# Patient Record
Sex: Female | Born: 2000 | Race: White | Hispanic: No | Marital: Single | Smoking: Never smoker
Health system: Southern US, Community
[De-identification: ages and names within clinical notes are randomized; demographics above are authoritative.]

## PROBLEM LIST (undated history)

## (undated) DIAGNOSIS — J45909 Unspecified asthma, uncomplicated: Secondary | ICD-10-CM

## (undated) DIAGNOSIS — M419 Scoliosis, unspecified: Secondary | ICD-10-CM

## (undated) HISTORY — DX: Scoliosis, unspecified: M41.9

## (undated) HISTORY — DX: Unspecified asthma, uncomplicated: J45.909

## (undated) HISTORY — PX: NO PAST SURGERIES: SHX2092

---
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
## 2021-10-25 ENCOUNTER — Other Ambulatory Visit: Payer: Self-pay

## 2021-10-25 ENCOUNTER — Encounter: Payer: Self-pay | Admitting: Medical-Surgical

## 2021-10-25 ENCOUNTER — Ambulatory Visit (INDEPENDENT_AMBULATORY_CARE_PROVIDER_SITE_OTHER): Payer: Medicaid Other | Admitting: Medical-Surgical

## 2021-10-25 VITALS — BP 104/71 | HR 98 | Resp 20 | Ht 65.0 in | Wt 125.3 lb

## 2021-10-25 DIAGNOSIS — E611 Iron deficiency: Secondary | ICD-10-CM

## 2021-10-25 DIAGNOSIS — Z7689 Persons encountering health services in other specified circumstances: Secondary | ICD-10-CM | POA: Diagnosis not present

## 2021-10-25 NOTE — Progress Notes (Signed)
New Patient Office Visit  Subjective:  Patient ID: Abigail Davis, female    DOB: December 05, 2000  Age: 20 y.o. MRN: [id number]  CC:  Chief Complaint  Patient presents with   Establish Care     HPI Abigail Davis presents to establish care.  She is a pleasant 20 year old female accompanied by her mother presenting today with reports of a history of having low iron.  She donated blood recently and was told that she had low iron.  She did start taking a multivitamin as well as an iron supplement on a daily basis.  She tolerates this well without side effects.  Has not had any lab work checked since then to evaluate her status.  She is currently attending [employer], living on campus.  She is taking the child life program and would like to be a child life specialist after graduation.  Notes that she does have very strange reactions to some medications.  Allegra, Benadryl, and Junel all caused psychosis with depression and severe anxiety.  She is very hesitant to take medications due to this reaction.  Past Medical History:  Diagnosis Date   Asthma    Scoliosis     History reviewed. No pertinent surgical history.  History reviewed. No pertinent family history.  Social History   Socioeconomic History   Marital status: Single    Spouse name: Not on file   Number of children: Not on file   Years of education: Not on file   Highest education level: Not on file  Occupational History   Not on file  Tobacco Use   Smoking status: Never   Smokeless tobacco: Never  Vaping Use   Vaping Use: Never used  Substance and Sexual Activity   Alcohol use: Never   Drug use: Never   Sexual activity: Never  Other Topics Concern   Not on file  Social History Narrative   Not on file   Social Determinants of Health   Financial Resource Strain: Not on file  Food Insecurity: Not on file  Transportation Needs: Not on file  Physical Activity: Not on file  Stress: Not on file  Social Connections:  Not on file  Intimate Partner Violence: Not on file    ROS Review of Systems  Constitutional:  Negative for chills, fatigue, fever and unexpected weight change.  HENT:  Negative for congestion, rhinorrhea, sinus pressure and sore throat.   Respiratory:  Negative for cough, chest tightness and shortness of breath.   Cardiovascular:  Negative for chest pain, palpitations and leg swelling.  Gastrointestinal:  Negative for abdominal pain, constipation, diarrhea, nausea and vomiting.  Endocrine: Negative for cold intolerance and heat intolerance.  Genitourinary:  Negative for dysuria, frequency and urgency.  Skin:  Negative for rash and wound.  Neurological:  Negative for dizziness, light-headedness and headaches.  Hematological:  Does not bruise/bleed easily.  Psychiatric/Behavioral:  Negative for dysphoric mood, self-injury, sleep disturbance and suicidal ideas. The patient is not nervous/anxious.    Objective:   Today's Vitals: BP 104/71 (BP Location: Left Arm, Patient Position: Sitting, Cuff Size: Normal)    Pulse 98    Resp 20    Ht 5\' 5"  (1.651 m)    Wt 125 lb 4.8 oz (56.8 kg)    LMP 09/30/2021 (Exact Date)    SpO2 97%    BMI 20.85 kg/m   Physical Exam Vitals reviewed.  Constitutional:      General: She is not in acute distress.    Appearance: Normal  appearance. She is normal weight. She is not ill-appearing.  HENT:     Head: Normocephalic and atraumatic.  Cardiovascular:     Rate and Rhythm: Normal rate and regular rhythm.     Pulses: Normal pulses.     Heart sounds: Normal heart sounds. No murmur heard.   No friction rub. No gallop.  Pulmonary:     Effort: Pulmonary effort is normal. No respiratory distress.     Breath sounds: Normal breath sounds. No wheezing.  Skin:    General: Skin is warm and dry.  Neurological:     Mental Status: She is alert and oriented to person, place, and time.  Psychiatric:        Mood and Affect: Mood normal.        Behavior: Behavior  normal.        Thought Content: Thought content normal.        Judgment: Judgment normal.    Assessment & Plan:   1. Encounter to establish care Reviewed available information and discussed care concerns with patient.   2. Iron deficiency Checking CBC with differential and iron panel today.  Continue iron supplementation. - CBC w/Diff/Platelet - Fe+TIBC+Fer  Outpatient Encounter Medications as of 10/25/2021  Medication Sig   [DISCONTINUED] albuterol (PROVENTIL HFA) 108 (90 Base) MCG/ACT inhaler Inhale 2 puffs into the lungs as needed for wheezing or shortness of breath.   No facility-administered encounter medications on file as of 10/25/2021.    Follow-up: Return if symptoms worsen or fail to improve.   Thayer Ohm, DNP, APRN, FNP-BC [facility] and Sports Medicine

## 2021-10-26 LAB — CBC WITH DIFFERENTIAL/PLATELET
Absolute Monocytes: 672 cells/uL (ref 200–950)
Basophils Absolute: 58 cells/uL (ref 0–200)
Basophils Relative: 0.7 %
Eosinophils Absolute: 755 cells/uL — ABNORMAL HIGH (ref 15–500)
Eosinophils Relative: 9.1 %
HCT: 38.7 % (ref 35.0–45.0)
Hemoglobin: 13 g/dL (ref 11.7–15.5)
Lymphs Abs: 3237 cells/uL (ref 850–3900)
MCH: 27.8 pg (ref 27.0–33.0)
MCHC: 33.6 g/dL (ref 32.0–36.0)
MCV: 82.7 fL (ref 80.0–100.0)
MPV: 11.3 fL (ref 7.5–12.5)
Monocytes Relative: 8.1 %
Neutro Abs: 3577 cells/uL (ref 1500–7800)
Neutrophils Relative %: 43.1 %
Platelets: 328 10*3/uL (ref 140–400)
RBC: 4.68 10*6/uL (ref 3.80–5.10)
RDW: 13.8 % (ref 11.0–15.0)
Total Lymphocyte: 39 %
WBC: 8.3 10*3/uL (ref 3.8–10.8)

## 2021-10-26 LAB — IRON,TIBC AND FERRITIN PANEL
%SAT: 40 % (calc) (ref 15–45)
Ferritin: 9 ng/mL — ABNORMAL LOW (ref 16–154)
Iron: 149 ug/dL (ref 27–164)
TIBC: 369 mcg/dL (calc) (ref 271–448)

## 2022-04-05 ENCOUNTER — Other Ambulatory Visit: Payer: Self-pay

## 2022-04-05 MED ORDER — ALBUTEROL SULFATE HFA 108 (90 BASE) MCG/ACT IN AERS: 2.0000 | INHALATION_SPRAY | RESPIRATORY_TRACT | 2 refills | Status: DC | PRN

## 2022-04-05 NOTE — Progress Notes (Signed)
LVM advising pt of RX.  Tiajuana Amass, CMA

## 2022-04-05 NOTE — Progress Notes (Signed)
Patient called wanting to know if she can get a new Rx for her Albuterol. She started having flare ups last night and needs this refilled.  This is not in her current medication list.

## 2022-04-05 NOTE — Progress Notes (Signed)
Albuterol sent to the pharmacy on file.  ___________________________________________ Thayer Ohm, DNP, APRN, FNP-BC Primary Care and Sports Medicine [facility] [place]

## 2022-04-10 ENCOUNTER — Telehealth: Payer: Self-pay

## 2022-04-10 MED ORDER — ALBUTEROL SULFATE HFA 108 (90 BASE) MCG/ACT IN AERS: 2.0000 | INHALATION_SPRAY | Freq: Four times a day (QID) | RESPIRATORY_TRACT | 2 refills | Status: AC | PRN

## 2022-04-10 NOTE — Telephone Encounter (Signed)
Order clarified and resent

## 2022-04-10 NOTE — Telephone Encounter (Signed)
Patient called to let us know that the pharmacy needed more information for the Albuterol.  I contacted the pharmacy and they need the frequency in order to fill the prescription.

## 2022-10-20 ENCOUNTER — Encounter: Payer: Self-pay | Admitting: Medical-Surgical

## 2022-10-20 ENCOUNTER — Ambulatory Visit (INDEPENDENT_AMBULATORY_CARE_PROVIDER_SITE_OTHER): Payer: Medicaid Other | Admitting: Medical-Surgical

## 2022-10-20 VITALS — BP 124/70 | HR 113 | Resp 20 | Ht 65.0 in | Wt 120.1 lb

## 2022-10-20 DIAGNOSIS — E611 Iron deficiency: Secondary | ICD-10-CM | POA: Diagnosis not present

## 2022-10-20 DIAGNOSIS — Z8269 Family history of other diseases of the musculoskeletal system and connective tissue: Secondary | ICD-10-CM

## 2022-10-20 DIAGNOSIS — G8929 Other chronic pain: Secondary | ICD-10-CM

## 2022-10-20 DIAGNOSIS — R198 Other specified symptoms and signs involving the digestive system and abdomen: Secondary | ICD-10-CM | POA: Diagnosis not present

## 2022-10-20 DIAGNOSIS — M545 Low back pain, unspecified: Secondary | ICD-10-CM

## 2022-10-20 NOTE — Progress Notes (Signed)
Established Patient Office Visit  Subjective   Patient ID: Abigail Davis, female   DOB: [birth year] Age: 21 y.o. MRN: [id number]   Chief Complaint  Patient presents with   Labs Only    Gene testing for HLAB27   HPI Pleasant 21 year old female presenting today for the following:  Iron deficiency: Currently taking iron Gummies and a women's multivitamin once daily.  Previous history of iron deficiency and would like to have this rechecked today.  Denies unusual fatigue, dizziness, or syncope.  Does have considerable concerns about genetic testing.  Notes that her sister was recently found to be positive for the HLA-B27 gene and is now being seen by rheumatology.  Since this diagnosis of her sister, she has started thinking about her own health related issues.  She used to be a cross-country runner and notes that her left knee developed a plica.  It was painful for a while but she no longer runs and this pain has resolved.  She does have scoliosis and had associated back pain several years ago but this also has gotten better over the last few years.  Notes that she has a very sensitive stomach and often has diarrhea.  Has done some research and found that all of these issues can be connected with rheumatoid arthritis or ankylosing spondylosis.  Is requesting additional testing to see if she is also positive for the gene or has any indicators of these conditions.  Aware that insurance may not cover it but they will be willing to pay out-of-pocket for this.   Objective:    Vitals:   10/20/22 0823  BP: 124/70  Pulse: (!) 113  Resp: 20  Height: _0  (1.651 m)  Weight: 120 lb 1.6 oz (54.5 kg)  SpO2: 97%  BMI (Calculated): 19.99    Physical Exam Vitals and nursing note reviewed.  Constitutional:      General: She is not in acute distress.    Appearance: Normal appearance. She is normal weight. She is not ill-appearing.  HENT:     Head: Normocephalic and atraumatic.  Cardiovascular:      Rate and Rhythm: Normal rate and regular rhythm.     Pulses: Normal pulses.     Heart sounds: Normal heart sounds.  Pulmonary:     Effort: Pulmonary effort is normal. No respiratory distress.     Breath sounds: Normal breath sounds. No wheezing, rhonchi or rales.  Skin:    General: Skin is warm and dry.  Neurological:     Mental Status: She is alert and oriented to person, place, and time.  Psychiatric:        Mood and Affect: Mood normal.        Behavior: Behavior normal.        Thought Content: Thought content normal.        Judgment: Judgment normal.   No results found for this or any previous visit (from the past 24 hour(s)).     The ASCVD Risk score (Arnett DK, et al., 2019) failed to calculate for the following reasons:   The 2019 ASCVD risk score is only valid for ages 35 to 58   Assessment & Plan:   1. Iron deficiency Checking CBC and iron panel today. - Fe+TIBC+Fer - CBC  2. Family history of ankylosing spondylitis 3. Chronic low back pain without sciatica, unspecified back pain laterality Checking labs as below. - ANA,IFA RA Diag Pnl w/rflx Tit/Patn - Sed Rate (ESR) - C-reactive protein - Cyclic citrul  peptide antibody, IgG - HLA-B27 antigen  4. GI symptoms Suspect this may be related to IBS or even anxiety.  Currently managed with lifestyle and dietary modifications.  No indication for medication today but we will do the investigation as above.  Return in about 1 year (around 10/21/2023) for annual physical exam.  ___________________________________________ Clearnce Sorrel, DNP, APRN, FNP-BC Primary Care and [place]

## 2022-10-23 LAB — ANTI-NUCLEAR AB-TITER (ANA TITER)
ANA TITER: 1:80 {titer} — ABNORMAL HIGH
ANA Titer 1: 1:80 {titer} — ABNORMAL HIGH

## 2022-10-23 LAB — IRON,TIBC AND FERRITIN PANEL
%SAT: 28 % (calc) (ref 16–45)
Ferritin: 17 ng/mL (ref 16–154)
Iron: 96 ug/dL (ref 40–190)
TIBC: 345 mcg/dL (calc) (ref 250–450)

## 2022-10-23 LAB — CBC
HCT: 41.1 % (ref 35.0–45.0)
Hemoglobin: 14 g/dL (ref 11.7–15.5)
MCH: 28.8 pg (ref 27.0–33.0)
MCHC: 34.1 g/dL (ref 32.0–36.0)
MCV: 84.6 fL (ref 80.0–100.0)
MPV: 10.9 fL (ref 7.5–12.5)
Platelets: 336 10*3/uL (ref 140–400)
RBC: 4.86 10*6/uL (ref 3.80–5.10)
RDW: 12 % (ref 11.0–15.0)
WBC: 7.3 10*3/uL (ref 3.8–10.8)

## 2022-10-23 LAB — HLA-B27 ANTIGEN: HLA-B27 Antigen: POSITIVE — AB

## 2022-10-23 LAB — ANA,IFA RA DIAG PNL W/RFLX TIT/PATN
Anti Nuclear Antibody (ANA): POSITIVE — AB
Cyclic Citrullin Peptide Ab: <16 UNITS
Rheumatoid fact SerPl-aCnc: <14 IU/mL (ref <14)

## 2022-10-23 LAB — SEDIMENTATION RATE: Sed Rate: 2 mm/h (ref 0–20)

## 2022-10-23 LAB — C-REACTIVE PROTEIN: CRP: <0.2 mg/L (ref <8.0)

## 2023-06-05 ENCOUNTER — Ambulatory Visit (INDEPENDENT_AMBULATORY_CARE_PROVIDER_SITE_OTHER): Payer: Medicaid Other | Admitting: Medical-Surgical

## 2023-06-05 ENCOUNTER — Encounter: Payer: Self-pay | Admitting: Medical-Surgical

## 2023-06-05 VITALS — BP 94/64 | HR 72 | Ht 64.0 in | Wt 122.8 lb

## 2023-06-05 DIAGNOSIS — Z0000 Encounter for general adult medical examination without abnormal findings: Secondary | ICD-10-CM | POA: Diagnosis not present

## 2023-06-05 DIAGNOSIS — Z124 Encounter for screening for malignant neoplasm of cervix: Secondary | ICD-10-CM | POA: Diagnosis not present

## 2023-06-05 NOTE — Progress Notes (Signed)
Complete physical exam  Patient: Abigail Davis   DOB: [birth year]   22 y.o. Female  MRN: [id number]  Subjective:    Chief Complaint  Patient presents with   Annual Exam    Patient  states her sister has Ankylosing spondylitis and requesting to be evaluated for the gene for this .- some lab work specific for this be drawn today ( Lipprotein A and ApolipoproteinB) - also requesting an order for echocardiogram complete without enhancing agent.     Abigail Davis is a 22 y.o. female who presents today for a complete physical exam. She reports consuming a general diet.  Walking a couple times weekly.  She generally feels well. She reports sleeping well. She does not have additional problems to discuss today.    Most recent fall risk assessment:    06/05/2023   10:20 AM  Fall Risk   Falls in the past year? 0  Number falls in past yr: 0  Injury with Fall? 0  Risk for fall due to : No Fall Risks  Follow up Falls evaluation completed     Most recent depression screenings:    06/05/2023   10:21 AM 10/20/2022    8:27 AM  PHQ 2/9 Scores  PHQ - 2 Score 0 0    Vision:Not within last year , Dental: No current dental problems and Receives regular dental care, and STD: The patient denies history of sexually transmitted disease.    Patient Care Team: Christen Butter, NP as PCP - General (Nurse Practitioner)   Outpatient Medications Prior to Visit  Medication Sig   albuterol (PROVENTIL HFA) 108 (90 Base) MCG/ACT inhaler Inhale 2 puffs into the lungs every 6 (six) hours as needed for wheezing or shortness of breath.   No facility-administered medications prior to visit.   Review of Systems  Constitutional:  Negative for chills, fever, malaise/fatigue and weight loss.  HENT:  Negative for congestion, ear pain, hearing loss, sinus pain and sore throat.   Eyes:  Negative for blurred vision, photophobia and pain.  Respiratory:  Negative for cough, shortness of breath and wheezing.    Cardiovascular:  Negative for chest pain, palpitations and leg swelling.  Gastrointestinal:  Negative for abdominal pain, constipation, diarrhea, heartburn, nausea and vomiting.  Genitourinary:  Negative for dysuria, frequency and urgency.  Musculoskeletal:  Negative for falls and neck pain.  Skin:  Negative for itching and rash.  Neurological:  Negative for dizziness, weakness and headaches.  Endo/Heme/Allergies:  Negative for environmental allergies and polydipsia. Does not bruise/bleed easily.  Psychiatric/Behavioral:  Negative for depression, substance abuse and suicidal ideas. The patient is not nervous/anxious and does not have insomnia.      Objective:    BP 94/64   Pulse 72   Ht 5\' 4"  (1.626 m)   Wt 122 lb 12 oz (55.7 kg)   SpO2 99%   BMI 21.07 kg/m    Physical Exam Constitutional:      General: She is not in acute distress.    Appearance: Normal appearance. She is not ill-appearing.  HENT:     Head: Normocephalic and atraumatic.     Right Ear: Tympanic membrane, ear canal and external ear normal. There is no impacted cerumen.     Left Ear: Tympanic membrane, ear canal and external ear normal. There is no impacted cerumen.     Nose: Nose normal. No congestion or rhinorrhea.     Mouth/Throat:     Mouth: Mucous membranes are moist.  Pharynx: No oropharyngeal exudate or posterior oropharyngeal erythema.  Eyes:     General: No scleral icterus.       Right eye: No discharge.        Left eye: No discharge.     Extraocular Movements: Extraocular movements intact.     Conjunctiva/sclera: Conjunctivae normal.     Pupils: Pupils are equal, round, and reactive to light.  Neck:     Thyroid: No thyromegaly.     Vascular: No carotid bruit or JVD.     Trachea: Trachea normal.  Cardiovascular:     Rate and Rhythm: Normal rate and regular rhythm.     Pulses: Normal pulses.     Heart sounds: Normal heart sounds. No murmur heard.    No friction rub. No gallop.  Pulmonary:      Effort: Pulmonary effort is normal. No respiratory distress.     Breath sounds: Normal breath sounds. No wheezing.  Abdominal:     General: Bowel sounds are normal. There is no distension.     Palpations: Abdomen is soft.     Tenderness: There is no abdominal tenderness. There is no guarding.  Musculoskeletal:        General: Normal range of motion.     Cervical back: Normal range of motion and neck supple.     Thoracic back: Scoliosis present.  Lymphadenopathy:     Cervical: No cervical adenopathy.  Skin:    General: Skin is warm and dry.  Neurological:     Mental Status: She is alert and oriented to person, place, and time.     Cranial Nerves: No cranial nerve deficit.  Psychiatric:        Mood and Affect: Mood normal.        Behavior: Behavior normal.        Thought Content: Thought content normal.        Judgment: Judgment normal.      No results found for any visits on 06/05/23.     Assessment & Plan:    Routine Health Maintenance and Physical Exam  Immunization History  Administered Date(s) Administered   DTaP 07/22/2002, 09/23/2002, 11/25/2002, 09/04/2003, 06/20/2007   HIB (PRP-OMP) 07/22/2002, 09/23/2002, 11/25/2002, 05/18/2003   Hepatitis A 07/15/2016, 06/04/2018   Hepatitis B 2001-12-01, 07/22/2002, 03/03/2003   Hpv-Unspecified 07/15/2016, 01/16/2017   IPV 07/22/2002, 09/23/2002, 03/03/2003, 06/20/2007   Influenza-Unspecified 07/07/2021, 08/20/2022   MMR 05/18/2003, 06/20/2007   PFIZER(Purple Top)SARS-COV-2 Vaccination 01/31/2020, 02/21/2020, 11/04/2020   Pneumococcal Conjugate-13 07/12/2002, 09/23/2002, 11/25/2002, 09/04/2003   Tdap 06/04/2018   Varicella 09/04/2003, 06/20/2007   Health Maintenance  Topic Date Due   PAP-Cervical Cytology Screening  05/14/2023   PAP SMEAR-Modifier  05/14/2023   COVID-19 Vaccine (4 - 2023-24 season) 06/21/2023 (Originally 07/07/2022)   Hepatitis C Screening  06/04/2024 (Originally 05/13/2020)   HIV Screening  06/04/2024  (Originally 05/13/2017)   INFLUENZA VACCINE  06/07/2023   DTaP/Tdap/Td (7 - Td or Tdap) 06/04/2028   HPV VACCINES  Completed    Discussed health benefits of physical activity, and encouraged her to engage in regular exercise appropriate for her age and condition.  1. Annual physical exam Deferring labs today.  Reviewed preventative care.  Wellness information provided with AVS.  2. Cervical cancer screening Due for her first Pap smear.  She has never been sexually active.  Saw OB/GYN a couple of years ago.  Advised that she is welcome to schedule an appointment to have that completed here or she is welcome to see OB/GYN  per her preference.  She will let me know when she is ready to have this done.  Return in about 1 year (around 06/04/2024) for annual physical exam or sooner if needed.   Christen Butter, NP

## 2023-06-08 ENCOUNTER — Encounter: Payer: Self-pay | Admitting: Medical-Surgical

## 2023-06-08 DIAGNOSIS — Z8269 Family history of other diseases of the musculoskeletal system and connective tissue: Secondary | ICD-10-CM

## 2023-06-20 ENCOUNTER — Encounter: Payer: Self-pay | Admitting: Medical-Surgical

## 2023-08-03 ENCOUNTER — Ambulatory Visit: Payer: Medicaid Other | Admitting: Cardiology

## 2023-09-26 DIAGNOSIS — J45909 Unspecified asthma, uncomplicated: Secondary | ICD-10-CM | POA: Insufficient documentation

## 2023-09-26 DIAGNOSIS — M419 Scoliosis, unspecified: Secondary | ICD-10-CM | POA: Insufficient documentation

## 2023-09-27 ENCOUNTER — Ambulatory Visit: Payer: Medicaid Other | Attending: Cardiology | Admitting: Cardiology

## 2023-09-27 ENCOUNTER — Encounter: Payer: Self-pay | Admitting: Cardiology

## 2023-09-27 VITALS — BP 98/62 | HR 76 | Ht 64.0 in | Wt 128.0 lb

## 2023-09-27 DIAGNOSIS — Z8269 Family history of other diseases of the musculoskeletal system and connective tissue: Secondary | ICD-10-CM | POA: Diagnosis not present

## 2023-09-27 DIAGNOSIS — R011 Cardiac murmur, unspecified: Secondary | ICD-10-CM | POA: Diagnosis not present

## 2023-09-27 NOTE — Progress Notes (Signed)
Cardiology Office Note:    Date:  09/27/2023   ID:  Abigail Davis, DOB 2001/01/09, MRN [id number]  PCP:  Christen Butter, NP  Cardiologist:  Garwin Brothers, MD   Referring MD: Christen Butter, NP    ASSESSMENT:    1. Family history of ankylosing spondylitis   2. Cardiac murmur    PLAN:    In order of problems listed above:  Primary prevention stressed with the patient.  Importance of compliance with diet medication stressed and patient verbalized standing. Patient was advised to be active and I congratulated her on her regular exercise activity. Cardiac murmur: Echocardiogram will be done to assess murmur heard on auscultation. She also requests blood work including lipid screening.  She requests apolipoprotein screening and I will oblige to her request. Patient will be seen in follow-up appointment in 6 months or earlier if the patient has any concerns.    Medication Adjustments/Labs and Tests Ordered: Current medicines are reviewed at length with the patient today.  Concerns regarding medicines are outlined above.  Orders Placed This Encounter  Procedures   Comprehensive metabolic panel   CBC   Lipoprotein A (LPA)   TSH   Lipid Panel+ApoB   EKG 12-Lead   ECHOCARDIOGRAM COMPLETE   No orders of the defined types were placed in this encounter.    History of Present Illness:    Abigail Davis is a 22 y.o. female who is being seen today for the evaluation of family history of ankylosing spondylitis and coronary artery disease at the request of Christen Butter, NP.  Patient is a pleasant 22 year old female.  She has no significant past medical history.  She has history of bronchial asthma.  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  Her dad died in his young age I think at the age of 70 with a coronary event.  Details are unclear.  A family member was diagnosed to have HLA-B27 and patient was sent here for screening for ankylosing spondylitis and echocardiogram.   She is an active lady.  She denies any chest pain orthopnea or PND.  She tells me that she walks on the treadmill on a regular basis for-is without any symptoms.  No syncope or dizziness.  At the time of my evaluation, the patient is alert awake oriented and in no distress.  Past Medical History:  Diagnosis Date   Adolescent idiopathic scoliosis of thoracic region 06/25/2014   Asthma    Scoliosis     Past Surgical History:  Procedure Laterality Date   NO PAST SURGERIES      Current Medications: Current Meds  Medication Sig   albuterol (PROVENTIL HFA) 108 (90 Base) MCG/ACT inhaler Inhale 2 puffs into the lungs every 6 (six) hours as needed for wheezing or shortness of breath.     Allergies:   Allegra [fexofenadine], Benadryl [diphenhydramine], and Junel fe 1-20 [norethin ace-eth estrad-fe]   Social History   Socioeconomic History   Marital status: Single    Spouse name: Not on file   Number of children: Not on file   Years of education: Not on file   Highest education level: Not on file  Occupational History   Not on file  Tobacco Use   Smoking status: Never   Smokeless tobacco: Never  Vaping Use   Vaping status: Never Used  Substance and Sexual Activity   Alcohol use: Never   Drug use: Never   Sexual activity: Never  Other Topics Concern  Not on file  Social History Narrative   Not on file   Social Determinants of Health   Financial Resource Strain: Not on file  Food Insecurity: Not on file  Transportation Needs: Not on file  Physical Activity: Not on file  Stress: Not on file  Social Connections: Unknown (03/20/2022)   [facility]   Social Network    Social Network: Not on file     Family History: The patient's family history includes Heart attack in her father; Hypertension in her sister. There is no history of Diabetes, Heart disease, or Cancer.  ROS:   Please see the history of present illness.    All other systems  reviewed and are negative.  EKGs/Labs/Other Studies Reviewed:    The following studies were reviewed today:  EKG Interpretation Date/Time:  Thursday September 27 2023 15:48:07 EST Ventricular Rate:  76 PR Interval:  138 QRS Duration:  84 QT Interval:  376 QTC Calculation: 423 R Axis:   68  Text Interpretation: Normal sinus rhythm with sinus arrhythmia Normal ECG No previous ECGs available Confirmed by Belva Crome [phone]) on 09/27/2023 3:59:32 PM     Recent Labs: 10/20/2022: Hemoglobin 14.0; Platelets 336  Recent Lipid Panel No results found for: "CHOL", "TRIG", "HDL", "CHOLHDL", "VLDL", "LDLCALC", "LDLDIRECT"  Physical Exam:    VS:  BP 98/62   Pulse 76   Ht 5\' 4"  (1.626 m)   Wt 128 lb 0.6 oz (58.1 kg)   SpO2 96%   BMI 21.98 kg/m     Wt Readings from Last 3 Encounters:  09/27/23 128 lb 0.6 oz (58.1 kg)  06/05/23 122 lb 12 oz (55.7 kg)  10/20/22 120 lb 1.6 oz (54.5 kg)     GEN: Patient is in no acute distress HEENT: Normal NECK: No JVD; No carotid bruits LYMPHATICS: No lymphadenopathy CARDIAC: S1 S2 regular, 2/6 systolic murmur at the apex. RESPIRATORY:  Clear to auscultation without rales, wheezing or rhonchi  ABDOMEN: Soft, non-tender, non-distended MUSCULOSKELETAL:  No edema; No deformity  SKIN: Warm and dry NEUROLOGIC:  Alert and oriented x 3 PSYCHIATRIC:  Normal affect    Signed, Garwin Brothers, MD  09/27/2023 4:16 PM    [facility]

## 2023-09-27 NOTE — Patient Instructions (Signed)
Medication Instructions:  Your physician recommends that you continue on your current medications as directed. Please refer to the Current Medication list given to you today.  *If you need a refill on your cardiac medications before your next appointment, please call your pharmacy*   Lab Work: Your physician recommends that you return for lab work in: the next few days for CMP, CBC, TSH, Lp(a), Apo Lipoprotein B and lipids. You need to have labs done when you are fasting. [facility] lab is located on the 3rd floor, [address]. Hours are Monday - Friday 8 am to 4 pm, closed 11:30 am to 1:00 pm. You do NOT need an appointment.   If you have labs (blood work) drawn today and your tests are completely normal, you will receive your results only by: MyChart Message (if you have MyChart) OR A paper copy in the mail If you have any lab test that is abnormal or we need to change your treatment, we will call you to review the results.   Testing/Procedures: Your physician has requested that you have an echocardiogram. Echocardiography is a painless test that uses sound waves to create images of your heart. It provides your doctor with information about the size and shape of your heart and how well your heart's chambers and valves are working. This procedure takes approximately one hour. There are no restrictions for this procedure. Please do NOT wear cologne, perfume, aftershave, or lotions (deodorant is allowed). Please arrive 15 minutes prior to your appointment time.     Follow-Up: At [facility], you and your health needs are our priority.  As part of our continuing mission to provide you with exceptional heart care, we have created designated Provider Care Teams.  These Care Teams include your primary Cardiologist (physician) and Advanced Practice Providers (APPs -  Physician Assistants and Nurse Practitioners) who all work together to provide you with the care you need, when you need it.  We  recommend signing up for the patient portal called "MyChart".  Sign up information is provided on this After Visit Summary.  MyChart is used to connect with patients for Virtual Visits (Telemedicine).  Patients are able to view lab/test results, encounter notes, upcoming appointments, etc.  Non-urgent messages can be sent to your provider as well.   To learn more about what you can do with MyChart, go to [email or web address].    Your next appointment:   12 month(s)  The format for your next appointment:   In Person  Provider:   Belva Crome, MD   Other Instructions Echocardiogram An echocardiogram is a test that uses sound waves (ultrasound) to produce images of the heart. Images from an echocardiogram can provide important information about: Heart size and shape. The size and thickness and movement of your heart's walls. Heart muscle function and strength. Heart valve function or if you have stenosis. Stenosis is when the heart valves are too narrow. If blood is flowing backward through the heart valves (regurgitation). A tumor or infectious growth around the heart valves. Areas of heart muscle that are not working well because of poor blood flow or injury from a heart attack. Aneurysm detection. An aneurysm is a weak or damaged part of an artery wall. The wall bulges out from the normal force of blood pumping through the body. Tell a health care provider about: Any allergies you have. All medicines you are taking, including vitamins, herbs, eye drops, creams, and over-the-counter medicines. Any blood disorders you have. Any surgeries you  have had. Any medical conditions you have. Whether you are pregnant or may be pregnant. What are the risks? Generally, this is a safe test. However, problems may occur, including an allergic reaction to dye (contrast) that may be used during the test. What happens before the test? No specific preparation is needed. You may eat and drink  normally. What happens during the test? You will take off your clothes from the waist up and put on a hospital gown. Electrodes or electrocardiogram (ECG)patches may be placed on your chest. The electrodes or patches are then connected to a device that monitors your heart rate and rhythm. You will lie down on a table for an ultrasound exam. A gel will be applied to your chest to help sound waves pass through your skin. A handheld device, called a transducer, will be pressed against your chest and moved over your heart. The transducer produces sound waves that travel to your heart and bounce back (or "echo" back) to the transducer. These sound waves will be captured in real-time and changed into images of your heart that can be viewed on a video monitor. The images will be recorded on a computer and reviewed by your health care provider. You may be asked to change positions or hold your breath for a short time. This makes it easier to get different views or better views of your heart. In some cases, you may receive contrast through an IV in one of your veins. This can improve the quality of the pictures from your heart. The procedure may vary among health care providers and hospitals.   What can I expect after the test? You may return to your normal, everyday life, including diet, activities, and medicines, unless your health care provider tells you not to do that. Follow these instructions at home: It is up to you to get the results of your test. Ask your health care provider, or the department that is doing the test, when your results will be ready. Keep all follow-up visits. This is important. Summary An echocardiogram is a test that uses sound waves (ultrasound) to produce images of the heart. Images from an echocardiogram can provide important information about the size and shape of your heart, heart muscle function, heart valve function, and other possible heart problems. You do not need to do  anything to prepare before this test. You may eat and drink normally. After the echocardiogram is completed, you may return to your normal, everyday life, unless your health care provider tells you not to do that. This information is not intended to replace advice given to you by your health care provider. Make sure you discuss any questions you have with your health care provider. Document Revised: 06/15/2020 Document Reviewed: 06/15/2020 Elsevier Patient Education  2021 Elsevier Inc.   Important Information About Sugar

## 2023-10-07 LAB — COMPREHENSIVE METABOLIC PANEL
ALT: 10 [IU]/L (ref 0–32)
AST: 18 [IU]/L (ref 0–40)
Albumin: 4.7 g/dL (ref 4.0–5.0)
Alkaline Phosphatase: 85 [IU]/L (ref 44–121)
BUN/Creatinine Ratio: 10 (ref 9–23)
BUN: 9 mg/dL (ref 6–20)
Bilirubin Total: 0.5 mg/dL (ref 0.0–1.2)
CO2: 27 mmol/L (ref 20–29)
Calcium: 9.6 mg/dL (ref 8.7–10.2)
Chloride: 100 mmol/L (ref 96–106)
Creatinine, Ser: 0.9 mg/dL (ref 0.57–1.00)
Globulin, Total: 2.9 g/dL (ref 1.5–4.5)
Glucose: 84 mg/dL (ref 70–99)
Potassium: 4.2 mmol/L (ref 3.5–5.2)
Sodium: 139 mmol/L (ref 134–144)
Total Protein: 7.6 g/dL (ref 6.0–8.5)
eGFR: 93 mL/min/{1.73_m2} (ref >59)

## 2023-10-07 LAB — CBC
Hematocrit: 42.1 % (ref 34.0–46.6)
Hemoglobin: 14 g/dL (ref 11.1–15.9)
MCH: 28.6 pg (ref 26.6–33.0)
MCHC: 33.3 g/dL (ref 31.5–35.7)
MCV: 86 fL (ref 79–97)
Platelets: 315 10*3/uL (ref 150–450)
RBC: 4.9 x10E6/uL (ref 3.77–5.28)
RDW: 12.1 % (ref 11.7–15.4)
WBC: 6.6 10*3/uL (ref 3.4–10.8)

## 2023-10-07 LAB — LIPID PANEL+APOB
Apolipoprotein B: 58 mg/dL (ref <90)
Cholesterol, Total: 125 mg/dL (ref 100–199)
HDL-C: 48 mg/dL (ref >39)
LDL-C (NIH Calc): 59 mg/dL (ref 0–99)
Non-HDL Cholesterol: 77 mg/dL (ref 0–129)
Triglycerides: 95 mg/dL (ref 0–149)

## 2023-10-07 LAB — TSH: TSH: 2.25 u[IU]/mL (ref 0.450–4.500)

## 2023-10-07 LAB — LIPOPROTEIN A (LPA): Lipoprotein (a): 38.4 nmol/L (ref <75.0)

## 2023-10-19 ENCOUNTER — Encounter: Payer: Self-pay | Admitting: Medical-Surgical

## 2023-10-22 ENCOUNTER — Ambulatory Visit (HOSPITAL_BASED_OUTPATIENT_CLINIC_OR_DEPARTMENT_OTHER)
Admission: RE | Admit: 2023-10-22 | Discharge: 2023-10-22 | Disposition: A | Discharge status: Home / Self Care | Payer: Medicaid Other | Source: Ambulatory Visit | Attending: Cardiology | Admitting: Cardiology

## 2023-10-22 DIAGNOSIS — R011 Cardiac murmur, unspecified: Secondary | ICD-10-CM | POA: Diagnosis not present

## 2023-10-22 LAB — ECHOCARDIOGRAM COMPLETE
AR max vel: 2.43 cm2
AV Area VTI: 2.23 cm2
AV Area mean vel: 2.33 cm2
AV Mean grad: 3 mm[Hg]
AV Peak grad: 5.2 mm[Hg]
Ao pk vel: 1.14 m/s
Area-P 1/2: 3.6 cm2
Calc EF: 59.6 %
S' Lateral: 2.2 cm
Single Plane A2C EF: 63.1 %
Single Plane A4C EF: 59 %

## 2023-10-22 NOTE — Telephone Encounter (Signed)
Patietn scheduled for visit with Christen Butter on Nov 06, 2023.

## 2023-11-06 ENCOUNTER — Encounter: Payer: Self-pay | Admitting: Medical-Surgical

## 2023-11-06 ENCOUNTER — Ambulatory Visit (INDEPENDENT_AMBULATORY_CARE_PROVIDER_SITE_OTHER): Payer: Medicaid Other | Admitting: Medical-Surgical

## 2023-11-06 VITALS — BP 100/63 | HR 81 | Resp 20 | Ht 64.0 in | Wt 133.6 lb

## 2023-11-06 DIAGNOSIS — E611 Iron deficiency: Secondary | ICD-10-CM | POA: Diagnosis not present

## 2023-11-06 DIAGNOSIS — R011 Cardiac murmur, unspecified: Secondary | ICD-10-CM | POA: Diagnosis not present

## 2023-11-06 DIAGNOSIS — Z23 Encounter for immunization: Secondary | ICD-10-CM | POA: Diagnosis not present

## 2023-11-06 NOTE — Progress Notes (Signed)
        Established patient visit  History, exam, impression, and plan:  1. Iron deficiency (Primary) Pleasant 22 year old accompanied by her mother presenting today to discuss iron deficiency.  She tried to donate blood while at school and was told that she could not because her iron levels were low again.  She has a history of iron deficiency and had been taking an iron supplement but she had quit for a while.  Since finding out that her iron levels were too low to donate blood, she has restarted her gummy iron supplement but would like to have her levels checked.  Denies any weakness, fatigue, pallor.  Checking CBC with differential and iron panel today.  Continue daily oral iron supplementation. - CBC with Differential/Platelet - Iron, TIBC and Ferritin Panel  2. Cardiac murmur Was able to get in with cardiology where they did a full evaluation including an echocardiogram.  The findings were reassuring however she noted later that they had documented she had a cardiac murmur.  On review of cardiology's note, it appears she had a 2/6 systolic murmur at the apex of the heart.  Her echocardiogram came back normal which was very reassuring.  Reassurance provided today that the findings are likely benign and may come and go.  All questions answered at the time of the appointment.  Procedures performed this visit: None.  Return if symptoms worsen or fail to improve.  __________________________________ Zada FREDRIK Palin, DNP, APRN, FNP-BC Primary Care and Sports Medicine [facility] [place]

## 2023-11-07 LAB — CBC WITH DIFFERENTIAL/PLATELET
Basophils Absolute: 0.1 10*3/uL (ref 0.0–0.2)
Basos: 1 %
EOS (ABSOLUTE): 0.6 10*3/uL — ABNORMAL HIGH (ref 0.0–0.4)
Eos: 7 %
Hematocrit: 41.4 % (ref 34.0–46.6)
Hemoglobin: 13.7 g/dL (ref 11.1–15.9)
Immature Grans (Abs): 0 10*3/uL (ref 0.0–0.1)
Immature Granulocytes: 0 %
Lymphocytes Absolute: 3.2 10*3/uL — ABNORMAL HIGH (ref 0.7–3.1)
Lymphs: 39 %
MCH: 28.6 pg (ref 26.6–33.0)
MCHC: 33.1 g/dL (ref 31.5–35.7)
MCV: 86 fL (ref 79–97)
Monocytes Absolute: 0.4 10*3/uL (ref 0.1–0.9)
Monocytes: 5 %
Neutrophils Absolute: 4 10*3/uL (ref 1.4–7.0)
Neutrophils: 48 %
Platelets: 371 10*3/uL (ref 150–450)
RBC: 4.79 x10E6/uL (ref 3.77–5.28)
RDW: 12.6 % (ref 11.7–15.4)
WBC: 8.4 10*3/uL (ref 3.4–10.8)

## 2023-11-07 LAB — IRON,TIBC AND FERRITIN PANEL
Ferritin: 26 ng/mL (ref 15–150)
Iron Saturation: 25 % (ref 15–55)
Iron: 76 ug/dL (ref 27–159)
Total Iron Binding Capacity: 307 ug/dL (ref 250–450)
UIBC: 231 ug/dL (ref 131–425)

## 2023-11-13 ENCOUNTER — Encounter: Payer: Self-pay | Admitting: Medical-Surgical

## 2023-11-13 DIAGNOSIS — G8929 Other chronic pain: Secondary | ICD-10-CM

## 2023-12-25 ENCOUNTER — Encounter: Payer: Self-pay | Admitting: Medical-Surgical

## 2024-01-09 ENCOUNTER — Encounter: Payer: Self-pay | Admitting: Medical-Surgical

## 2024-01-15 ENCOUNTER — Telehealth: Payer: Self-pay | Admitting: Medical-Surgical

## 2024-01-15 NOTE — Telephone Encounter (Unsigned)
 Copied from CRM [id number]. Topic: General - Other >> Jan 15, 2024 10:36 AM Nila Nephew wrote: Reason for CRM: Referral office for PT is calling to request we fax over patient demographics, as they cannot contact patient without it. Please fax to [phone].

## 2024-01-16 ENCOUNTER — Telehealth: Payer: Self-pay | Admitting: Medical-Surgical

## 2024-01-16 NOTE — Telephone Encounter (Signed)
Faxed, Thanks

## 2024-01-16 NOTE — Telephone Encounter (Unsigned)
 Copied from CRM [id number]. Topic: General - Phone/Fax/Address >> Jan 16, 2024 11:52 AM Higinio Roger wrote: Inetta Fermo from [facility] outpatient rehab is requesting a face sheet with patient demographics due to not being able to read it from the referral sent in for physical therapy.   Callback #: [phone] Fax #: [phone]

## 2024-01-21 ENCOUNTER — Telehealth: Payer: Self-pay | Admitting: Medical-Surgical

## 2024-01-21 NOTE — Telephone Encounter (Signed)
 Printed and faxed

## 2024-01-21 NOTE — Telephone Encounter (Signed)
 Copied from CRM [id number]. Topic: General - Inquiry >> Jan 21, 2024 10:12 AM Haroldine Laws wrote: Reason for CRM: Inetta Fermo with [facility] needing a face sheet with demographics sent to them by fax.  Fax [phone]  Phone # [phone]

## 2024-01-21 NOTE — Telephone Encounter (Signed)
 Copied from CRM [id number]. Topic: Referral - Question >> Jan 21, 2024  8:51 AM Nila Nephew wrote: Reason for CRM: Sena with [facility] outpatient re[facility] is requesting a demographic sheet be sent over regarding patient, as they cannot act on the referral until they have a way to contact the patient.

## 2024-02-07 ENCOUNTER — Telehealth: Payer: Self-pay | Admitting: Medical-Surgical

## 2024-02-07 DIAGNOSIS — M545 Low back pain, unspecified: Secondary | ICD-10-CM

## 2024-02-07 NOTE — Telephone Encounter (Signed)
 Copied from CRM [id number]. Topic: Referral - Status >> Feb 07, 2024  1:14 PM Carrielelia G wrote: Theotis Burrow with [facility] Is calling to ask if you could please correct the referral that was sent over.  You still have the name of a previous facility on the referral ([facility]) Please remove and refax.    Ofc# [phone] Fax# [phone]

## 2024-02-07 NOTE — Telephone Encounter (Signed)
 I looked at the referral for physical therapy on my end and did not see the facility that they called about listed on it in order to remove it. Can you check into this for me? Thanks [employer]

## 2024-02-07 NOTE — Telephone Encounter (Signed)
 It is under referral notes,but those can't be edited/deleted.

## 2024-02-08 NOTE — Addendum Note (Signed)
 Addended byChristen Butter on: 02/08/2024 07:06 AM   Modules accepted: Orders

## 2024-02-08 NOTE — Telephone Encounter (Signed)
 A  new referral has been placed as we could not remove the other facility from the referral notes.   ___________________________________________ Thayer Ohm, DNP, APRN, FNP-BC Primary Care and Sports Medicine [facility] [place]

## 2024-11-02 NOTE — Progress Notes (Unsigned)
 "       Established patient visit   History of Present Illness   Discussed the use of AI scribe software for clinical note transcription with the patient, who gave verbal consent to proceed.  History of Present Illness   Abigail Davis is a 23 year old female who presents with a rash and requests allergy testing and iron level recheck.  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 - No pets at school - Claritin ineffective - Avoids Benadryl due to prior adverse reactions  Iron deficiency symptoms and monitoring - Low iron identified approximately one year ago - Not taking iron supplements - Currently takes a multivitamin that may contain iron - Requests iron level recheck and supplementation  Asthma and recurrent infections - History of asthma - Advised to receive pneumonia vaccine due to asthma  Other chronic medical conditions - History of scoliosis - History of cardiac murmur  Current medications and supplements - Takes multivitamin and vitamin C only     Physical Exam   Physical Exam Vitals reviewed.  Constitutional:      General: She is not in acute distress.    Appearance: Normal appearance.  HENT:     Head: Normocephalic and atraumatic.  Cardiovascular:     Rate and Rhythm: Normal rate and regular rhythm.     Pulses: Normal pulses.     Heart sounds: Normal heart sounds. No murmur heard.    No friction rub. No gallop.  Pulmonary:     Effort: Pulmonary effort is normal. No respiratory distress.     Breath sounds: Normal breath sounds. No wheezing.  Skin:    General:  Skin is warm and dry.     Findings: Rash (Erythematous maculopapular rash to the bilateral inner forearms and abdomen) present.  Neurological:     Mental Status: She is alert and oriented to person, place, and time.  Psychiatric:        Mood and Affect: Mood normal.        Behavior: Behavior normal.        Thought Content: Thought content normal.        Judgment: Judgment normal.    Assessment & Plan     Rash Inflammatory etiology suggested by black light evaluation. - Prescribed triamcinolone cream, apply twice daily for up to 14 days. - Advised use of topical Benadryl as needed for pruritus. - Recommended keeping a diary of potential triggers. - Referred to allergist for comprehensive allergy testing.  Iron deficiency Previous low iron levels, not currently on iron supplements. - Ordered iron level recheck. - Advised to consider resuming iron supplementation.  Asthma Discussed recent guideline changes for pneumonia vaccination. - Discussed pneumonia vaccination guidelines and offered flu shot.  Cardiac murmur Transient systolic murmur, not currently audible. - Continue monitoring cardiac murmur.  General health maintenance Discussed cervical cancer screening and vaccinations. - Referred to OBGYN for cervical cancer screening. - Discussed pneumonia and flu vaccinations.     Follow up   Return if symptoms worsen or fail to improve. __________________________________ Zada FREDRIK Palin, DNP, APRN, FNP-BC [facility]  and Sports Medicine [facility] [place] "

## 2024-11-03 ENCOUNTER — Ambulatory Visit: Admitting: Medical-Surgical

## 2024-11-03 ENCOUNTER — Encounter: Payer: Self-pay | Admitting: Medical-Surgical

## 2024-11-03 VITALS — BP 99/66 | HR 88 | Resp 20 | Ht 64.0 in | Wt 139.0 lb

## 2024-11-03 DIAGNOSIS — R21 Rash and other nonspecific skin eruption: Secondary | ICD-10-CM

## 2024-11-03 DIAGNOSIS — E611 Iron deficiency: Secondary | ICD-10-CM

## 2024-11-03 DIAGNOSIS — R011 Cardiac murmur, unspecified: Secondary | ICD-10-CM

## 2024-11-03 DIAGNOSIS — Z124 Encounter for screening for malignant neoplasm of cervix: Secondary | ICD-10-CM | POA: Diagnosis not present

## 2024-11-03 DIAGNOSIS — Z0182 Encounter for allergy testing: Secondary | ICD-10-CM

## 2024-11-03 DIAGNOSIS — J4520 Mild intermittent asthma, uncomplicated: Secondary | ICD-10-CM

## 2024-11-03 MED ORDER — TRIAMCINOLONE ACETONIDE 0.1 % EX CREA: 1.0000 | TOPICAL_CREAM | Freq: Two times a day (BID) | CUTANEOUS | 0 refills | Status: AC

## 2024-11-04 ENCOUNTER — Ambulatory Visit: Payer: Self-pay | Admitting: Medical-Surgical

## 2024-11-04 LAB — IRON,TIBC AND FERRITIN PANEL
Ferritin: 28 ng/mL (ref 15–150)
Iron Saturation: 27 % (ref 15–55)
Iron: 107 ug/dL (ref 27–159)
Total Iron Binding Capacity: 398 ug/dL (ref 250–450)
UIBC: 291 ug/dL (ref 131–425)
